# Patient Record
Sex: Female | Born: 1980 | Race: White | Hispanic: No | Marital: Single | Smoking: Never smoker
Health system: Southern US, Community
[De-identification: ages and names within clinical notes are randomized; demographics above are authoritative.]

## PROBLEM LIST (undated history)

## (undated) HISTORY — PX: HAND SURGERY: SHX662

---
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
## 2001-08-28 ENCOUNTER — Emergency Department (HOSPITAL_COMMUNITY): Admission: EM | Admit: 2001-08-28 | Discharge: 2001-08-28 | Payer: Self-pay | Admitting: Emergency Medicine

## 2001-08-31 ENCOUNTER — Emergency Department (HOSPITAL_COMMUNITY): Admission: EM | Admit: 2001-08-31 | Discharge: 2001-08-31 | Payer: Self-pay | Admitting: *Deleted

## 2003-03-05 ENCOUNTER — Emergency Department (HOSPITAL_COMMUNITY): Admission: EM | Admit: 2003-03-05 | Discharge: 2003-03-05 | Payer: Self-pay | Admitting: Emergency Medicine

## 2012-11-26 ENCOUNTER — Ambulatory Visit
Admission: RE | Admit: 2012-11-26 | Discharge: 2012-11-26 | Disposition: A | Discharge status: Home / Self Care | Payer: PRIVATE HEALTH INSURANCE | Source: Ambulatory Visit | Attending: Occupational Medicine | Admitting: Occupational Medicine

## 2012-11-26 ENCOUNTER — Other Ambulatory Visit: Payer: Self-pay | Admitting: Occupational Medicine

## 2012-11-26 DIAGNOSIS — Z021 Encounter for pre-employment examination: Secondary | ICD-10-CM

## 2014-01-20 ENCOUNTER — Encounter (HOSPITAL_COMMUNITY): Payer: Self-pay | Admitting: Emergency Medicine

## 2014-01-20 ENCOUNTER — Emergency Department (HOSPITAL_COMMUNITY): Payer: Worker's Compensation

## 2014-01-20 ENCOUNTER — Emergency Department (HOSPITAL_COMMUNITY)
Admission: EM | Admit: 2014-01-20 | Discharge: 2014-01-20 | Disposition: A | Discharge status: Home / Self Care | Payer: Worker's Compensation | Attending: Emergency Medicine | Admitting: Emergency Medicine

## 2014-01-20 DIAGNOSIS — S62364A Nondisplaced fracture of neck of fourth metacarpal bone, right hand, initial encounter for closed fracture: Secondary | ICD-10-CM | POA: Insufficient documentation

## 2014-01-20 DIAGNOSIS — S60512A Abrasion of left hand, initial encounter: Secondary | ICD-10-CM | POA: Insufficient documentation

## 2014-01-20 DIAGNOSIS — S5002XA Contusion of left elbow, initial encounter: Secondary | ICD-10-CM | POA: Diagnosis not present

## 2014-01-20 DIAGNOSIS — S62609A Fracture of unspecified phalanx of unspecified finger, initial encounter for closed fracture: Secondary | ICD-10-CM

## 2014-01-20 DIAGNOSIS — Y9289 Other specified places as the place of occurrence of the external cause: Secondary | ICD-10-CM | POA: Insufficient documentation

## 2014-01-20 DIAGNOSIS — Y9389 Activity, other specified: Secondary | ICD-10-CM | POA: Diagnosis not present

## 2014-01-20 DIAGNOSIS — T1490XA Injury, unspecified, initial encounter: Secondary | ICD-10-CM

## 2014-01-20 DIAGNOSIS — S6992XA Unspecified injury of left wrist, hand and finger(s), initial encounter: Secondary | ICD-10-CM | POA: Diagnosis present

## 2014-01-20 DIAGNOSIS — S63267A Dislocation of metacarpophalangeal joint of left little finger, initial encounter: Secondary | ICD-10-CM | POA: Insufficient documentation

## 2014-01-20 DIAGNOSIS — Y998 Other external cause status: Secondary | ICD-10-CM | POA: Diagnosis not present

## 2014-01-20 DIAGNOSIS — W11XXXA Fall on and from ladder, initial encounter: Secondary | ICD-10-CM | POA: Insufficient documentation

## 2014-01-20 DIAGNOSIS — S63259A Unspecified dislocation of unspecified finger, initial encounter: Secondary | ICD-10-CM

## 2014-01-20 DIAGNOSIS — W19XXXA Unspecified fall, initial encounter: Secondary | ICD-10-CM

## 2014-01-20 MED ORDER — HYDROCODONE-ACETAMINOPHEN 5-325 MG PO TABS: 1.0000 | ORAL_TABLET | Freq: Four times a day (QID) | ORAL | Status: DC | PRN

## 2014-01-20 MED ORDER — ONDANSETRON 4 MG PO TBDP
4.0000 mg | ORAL_TABLET | Freq: Once | ORAL | Status: AC
  Administered 2014-01-20: 4 mg via ORAL
  Filled 2014-01-20: qty 1

## 2014-01-20 MED ORDER — HYDROCODONE-ACETAMINOPHEN 5-325 MG PO TABS
2.0000 | ORAL_TABLET | Freq: Once | ORAL | Status: AC
  Administered 2014-01-20: 2 via ORAL
  Filled 2014-01-20: qty 2

## 2014-01-20 MED ORDER — FENTANYL CITRATE 0.05 MG/ML IJ SOLN
100.0000 ug | Freq: Once | INTRAMUSCULAR | Status: AC
  Administered 2014-01-20: 100 ug via INTRAVENOUS
  Filled 2014-01-20: qty 2

## 2014-01-20 MED ORDER — LIDOCAINE-EPINEPHRINE (PF) 2 %-1:200000 IJ SOLN
10.0000 mL | Freq: Once | INTRAMUSCULAR | Status: AC
  Administered 2014-01-20: 10 mL
  Filled 2014-01-20: qty 20

## 2014-01-20 MED ORDER — MIDAZOLAM HCL 2 MG/2ML IJ SOLN
4.0000 mg | Freq: Once | INTRAMUSCULAR | Status: AC
  Administered 2014-01-20: 4 mg via INTRAVENOUS
  Filled 2014-01-20: qty 4

## 2014-01-20 MED ORDER — PROPOFOL 10 MG/ML IV BOLUS
INTRAVENOUS | Status: AC
  Administered 2014-01-20: 25 mg via INTRAVENOUS
  Filled 2014-01-20: qty 20

## 2014-01-20 MED ORDER — PROPOFOL 10 MG/ML IV BOLUS
0.5000 mg/kg | Freq: Once | INTRAVENOUS | Status: AC
  Administered 2014-01-20: 25 mg via INTRAVENOUS

## 2014-01-20 NOTE — ED Notes (Signed)
Pt reports relief of pain s/p splint application.  +cms distal to splint noted

## 2014-01-20 NOTE — ED Notes (Signed)
Pt returned from xray, resting comfortably in room with family at bedside

## 2014-01-20 NOTE — ED Notes (Signed)
Pt reports fall from ladder approx 8 feet landing on concrete left hand and side.  Pt arrives awake, alert, oriented, c/o left hand and back pain.

## 2014-01-20 NOTE — ED Provider Notes (Signed)
CSN: [id number]     Arrival date & time 01/20/14  1051 History   First MD Initiated Contact with Patient 01/20/14 1052     Chief Complaint  Patient presents with  . Fall     (Consider location/radiation/quality/duration/timing/severity/associated sxs/prior Treatment) HPI Comments: Patient presents emergency department with chief complaint of fall from ladder. She states that she fell approximately 8 feet from ladder landing on concrete on her left side. She denies any LOC or head injury. She complains of left hand and elbow pain and some pain with inspiration. She denies any other injuries. 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 She has not taken anything to alleviate her symptoms. Her symptoms are worsened with movement. She was wearing all of her protective fire equipment, and thinks that this significantly reduced impact.  The history is provided by the patient. No language interpreter was used.    History reviewed. No pertinent past medical history. History reviewed. No pertinent past surgical history. History reviewed. No pertinent family history. History  Substance Use Topics  . Smoking status: Never Smoker   . Smokeless tobacco: Not on file  . Alcohol Use: No   OB History    No data available     Review of Systems  Constitutional: Negative for fever and chills.  Respiratory: Negative for shortness of breath.   Cardiovascular: Negative for chest pain.  Gastrointestinal: Negative for nausea, vomiting, diarrhea and constipation.  Genitourinary: Negative for dysuria.  Musculoskeletal: Positive for arthralgias.  All other systems reviewed and are negative.     Allergies  Review of patient's allergies indicates no known allergies.  Home Medications   Prior to Admission medications   Not on File   BP 99/67 mmHg  Pulse 79  Temp(Src) 97.8 F (36.6 C) (Oral)  Resp 22  Ht 5\' 8"  (1.727 m)  Wt 161 lb (73.029 kg)  BMI 24.49 kg/m2  SpO2 99%  LMP  01/04/2014 Physical Exam  Constitutional: She is oriented to person, place, and time. She appears well-developed and well-nourished.  HENT:  Head: Normocephalic and atraumatic.  Eyes: Conjunctivae and EOM are normal. Pupils are equal, round, and reactive to light.  Neck: Normal range of motion. Neck supple.  Cardiovascular: Normal rate and regular rhythm.  Exam reveals no gallop and no friction rub.   No murmur heard. Pulmonary/Chest: Effort normal and breath sounds normal. No respiratory distress. She has no wheezes. She has no rales. She exhibits no tenderness.  No anterior or lateral chest wall tenderness, lungs are clear to auscultation  Abdominal: Soft. Bowel sounds are normal. She exhibits no distension and no mass. There is no tenderness. There is no rebound and no guarding.  Musculoskeletal: Normal range of motion. She exhibits no edema or tenderness.  Left hand moderately swollen over the posterior aspect, left grip strength is reduced secondary to pain, no range of motion deficits, left elbow remarkable for a mild contusion, no bony abnormality or deformity  Moves all other extremities through all ranges of motion and with 5 out of 5 strength  Neurological: She is alert and oriented to person, place, and time.  Skin: Skin is warm and dry.  Mild abrasion to left hand, no laceration or puncture wound  Psychiatric: She has a normal mood and affect. Her behavior is normal. Judgment and thought content normal.  Nursing note and vitals reviewed.   ED Course  Procedures (including critical care time) No results found for this or any previous visit. Dg  Chest 2 View  01/20/2014   CLINICAL DATA:  33 year old female with sternal and left chest pain since 0950 hr after fall from ladder 8 feet onto concrete. Initial encounter.  EXAM: CHEST  2 VIEW  COMPARISON:  Chest radiograph 11/26/2012.  FINDINGS: Normal lung volumes. Anterior clear space within normal limits. Normal cardiac size and  mediastinal contours. Visualized tracheal air column is within normal limits. No pneumothorax, pulmonary edema, pleural effusion or confluent pulmonary opacity. Minimal spinal curvature. No acute osseous abnormality identified.  IMPRESSION: No acute cardiopulmonary abnormality or acute traumatic injury identified.   Electronically Signed   By: Augusto GambleLee  Hall M.D.   On: 01/20/2014 12:55   Dg Forearm Left  01/20/2014   CLINICAL DATA:  Fall  EXAM: LEFT FOREARM - 2 VIEW  COMPARISON:  Left hand today  FINDINGS: Fracture base of the fourth metacarpal.  Negative for fracture of the radius or ulna. Wrist joint and elbow joint appear normal.  IMPRESSION: Negative forearm.  Fracture base of the fourth metacarpal.   Electronically Signed   By: Marlan Palauharles  Clark M.D.   On: 01/20/2014 12:57   Dg Hand Complete Left  01/20/2014   CLINICAL DATA:  Larey SeatFell from ladder with left hand and fifth finger pain  EXAM: LEFT HAND - COMPLETE 3+ VIEW  COMPARISON:  None.  FINDINGS: There is dislocation of the left fifth MCP joint with the proximal phalanx lateral and dorsal to the left fifth metacarpal. Also, there is an oblique fracture through the base of the left fourth metacarpal. No other acute abnormality is seen. The radiocarpal joint space is unremarkable.  IMPRESSION: 1. Dislocation of the left fifth MCP joint. 2. Oblique nondisplaced fracture through the base of the left fourth metacarpal.   Electronically Signed   By: Dwyane DeePaul  Barry M.D.   On: 01/20/2014 12:56      EKG Interpretation None      MDM   Final diagnoses:  Fall  Trauma  Finger fracture, left, closed, initial encounter  Finger dislocation, initial encounter    Patient with mechanical fall from ladder. Impact significantly lessened by protective equipment. Will check imaging and reassess. Patient does not want any pain medicine at this time.  Patient fell from ladder landing on side, there was dislocation of the 5th MCP, but no fracture.  She has a small palmar  abrasion which is away from the dislocation and there is no evidence of open joint or open fracture.  Postreduction, patient is able make a fist. Finger splint initially, however will consult hand surgery for proper immobilization guidelines because the patient has the dislocation and concurrent fourth metacarpal fracture.  Roxy Horsemanobert Armida Vickroy, PA-C 01/20/14 1610  Vida RollerBrian D Miller, MD 01/21/14 [phone]

## 2014-01-20 NOTE — Discharge Instructions (Signed)
Finger Dislocation  Finger dislocation is the displacement of bones in your finger at the joints. Most commonly, finger dislocation occurs at the proximal interphalangeal joint (the joint closest to your knuckle). Very strong, fibrous tissues (ligaments) and joint capsules connect the three bones of your fingers.   CAUSES  Dislocation is caused by a forceful impact. This impact moves these bones off the joint and often tears your ligaments.   SYMPTOMS  Symptoms of finger dislocation include:   Deformity of your finger.   Pain, with loss of movement.  DIAGNOSIS   Finger dislocation is diagnosed with a physical exam. Often, X-ray exams are done to see if you have associated injuries, such as bone fractures.  TREATMENT   Finger dislocations are treated by putting your bones back into position (reduction) either by manually moving the bones back into place or through surgery. Your finger is then kept in a fixed position (immobilized) with the use of a dressing or splint for a brief period.  When your ligament has to be surgically repaired, it needs to be kept in a fixed position with a dressing or splint for 1 to 2 weeks. Because joint stiffness is a long-term complication of finger dislocation, hand exercises or physical therapy to increase the range of motion and to regain strength is usually started as soon as the ligament is healed. Exercises and therapy generally last no more than 3 months.  HOME CARE INSTRUCTIONS  The following measures can help to reduce pain and speed up the healing process:   Rest your injured joint. Do not move until instructed otherwise by your caregiver. Avoid activities similar to the one that caused your injury.   Apply ice to your injured joint for the first day or 2 after your reduction or as directed by your caregiver. Applying ice helps to reduce inflammation and pain.   Put ice in a plastic bag.   Place a towel between your skin and the bag.   Leave the ice on for 15-20 minutes  at a time, every 2 hours while you are awake.   Elevate your hand above your heart as directed by your caregiver to reduce swelling.   Take over-the-counter or prescription medicine for pain as your caregiver instructs you.  SEEK IMMEDIATE MEDICAL CARE IF:   Your dressing or splint becomes damaged.   Your pain becomes worse rather than better.   You lose feeling in your finger, or it becomes cold and white.  MAKE SURE YOU:   Understand these instructions.   Will watch your condition.   Will get help right away if you are not doing well or get worse.  Document Released: 01/20/2000 Document Revised: 04/16/2011 Document Reviewed: 11/12/2010  ExitCare Patient Information 2015 [employer]. This information is not intended to replace advice given to you by your health care provider. Make sure you discuss any questions you have with your health care provider.  Finger Fracture  Fractures of fingers are breaks in the bones of the fingers. There are many types of fractures. There are different ways of treating these fractures. Your health care provider will discuss the best way to treat your fracture.  CAUSES  Traumatic injury is the main cause of broken fingers. These include:   Injuries while playing sports.   Workplace injuries.   Falls.  RISK FACTORS  Activities that can increase your risk of finger fractures include:   Sports.   Workplace activities that involve machinery.   A condition called   osteoporosis, which can make your bones less dense and cause them to fracture more easily.  SIGNS AND SYMPTOMS  The main symptoms of a broken finger are pain and swelling within 15 minutes after the injury. Other symptoms include:   Bruising of your finger.   Stiffness of your finger.   Numbness of your finger.   Exposed bones (compound fracture) if the fracture is severe.  DIAGNOSIS   The best way to diagnose a broken bone is with X-ray imaging. Additionally, your health care provider will use this X-ray  image to evaluate the position of the broken finger bones.   TREATMENT   Finger fractures can be treated with:    Nonreduction--This means the bones are in place. The finger is splinted without changing the positions of the bone pieces. The splint is usually left on for about a week to 10 days. This will depend on your fracture and what your health care provider thinks.   Closed reduction--The bones are put back into position without using surgery. The finger is then splinted.   Open reduction and internal fixation--The fracture site is opened. Then the bone pieces are fixed into place with pins or some type of hardware. This is seldom required. It depends on the severity of the fracture.  HOME CARE INSTRUCTIONS    Follow your health care provider's instructions regarding activities, exercises, and physical therapy.   Only take over-the-counter or prescription medicines for pain, discomfort, or fever as directed by your health care provider.  SEEK MEDICAL CARE IF:  You have pain or swelling that limits the motion or use of your fingers.  SEEK IMMEDIATE MEDICAL CARE IF:   Your finger becomes numb.  MAKE SURE YOU:    Understand these instructions.   Will watch your condition.   Will get help right away if you are not doing well or get worse.  Document Released: 05/06/2000 Document Revised: 11/12/2012 Document Reviewed: 09/03/2012  ExitCare Patient Information 2015 [employer]. This information is not intended to replace advice given to you by your health care provider. Make sure you discuss any questions you have with your health care provider.

## 2014-01-20 NOTE — Progress Notes (Signed)
Orthopedic Tech Progress Note Patient Details:  Brittany Bradford [birth year] [id number]  Ortho Devices Type of Ortho Device: Ace wrap, Ulna gutter splint Ortho Device/Splint Location: LUE Ortho Device/Splint Interventions: Ordered, Application   Jennye MoccasinHughes, [name] 01/20/2014, 4:16 PM

## 2015-10-26 ENCOUNTER — Encounter: Payer: Self-pay | Admitting: Internal Medicine

## 2015-10-26 ENCOUNTER — Ambulatory Visit (INDEPENDENT_AMBULATORY_CARE_PROVIDER_SITE_OTHER): Payer: 59 | Admitting: Internal Medicine

## 2015-10-26 VITALS — BP 110/60 | HR 80 | Temp 99.2°F | Resp 16 | Ht 67.0 in | Wt 162.0 lb

## 2015-10-26 DIAGNOSIS — Z0000 Encounter for general adult medical examination without abnormal findings: Secondary | ICD-10-CM | POA: Diagnosis not present

## 2015-10-26 NOTE — Progress Notes (Signed)
Annual Screening Comprehensive Examination   This very nice 35 y.o.female presents for complete physical.  Patient has no major health issues.  Patient reports no complaints at this time.   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  She is very active with work.  She is really big into crossfit.    Patient is in a committed relationship with her partner Victory DakinRiley.  She reports that she has never had a pap smear.  She is nervous about it.  She does not see obgyn.  She would prefer to be a female if she had the opportunity financially to have a sex change.    She does note that she does have some trouble with falling asleep.  She reports that she is wide awake and has trouble getting to sleep.     Finally, patient has history of Vitamin D Deficiency and last vitamin D was No results found for: VD25OH.  Currently on supplementation.     No current outpatient prescriptions on file prior to visit.   No current facility-administered medications on file prior to visit.     No Known Allergies  No past medical history on file.   There is no immunization history on file for this patient.  No past surgical history on file.  No family history on file.  Social History   Social History  . Marital status: Single    Spouse name: N/A  . Number of children: N/A  . Years of education: N/A   Occupational History  . Not on file.   Social History Main Topics  . Smoking status: Never Smoker  . Smokeless tobacco: Not on file  . Alcohol use No  . Drug use: No  . Sexual activity: Not Currently   Other Topics Concern  . Not on file   Social History Narrative  . No narrative on file   Review of Systems  Constitutional: Negative for chills, fever and malaise/fatigue.  HENT: Negative for congestion, ear pain and sore throat.   Respiratory: Negative for cough, shortness of breath and wheezing.   Cardiovascular: Negative for chest pain, palpitations and leg swelling.   Gastrointestinal: Positive for diarrhea. Negative for abdominal pain, blood in stool, constipation, heartburn and melena.  Genitourinary: Negative.   Neurological: Negative for dizziness, sensory change, loss of consciousness and headaches.  Psychiatric/Behavioral: Negative for depression. The patient has insomnia. The patient is not nervous/anxious.       Physical Exam  BP 110/60   Pulse 80   Temp 99.2 F (37.3 C) (Temporal)   Resp 16   Ht 5\' 7"  (1.702 m)   Wt 162 lb (73.5 kg)   BMI 25.37 kg/m   General Appearance: Well nourished and in no apparent distress. Eyes: PERRLA, EOMs, conjunctiva no swelling or erythema, normal fundi and vessels. Sinuses: No frontal/maxillary tenderness ENT/Mouth: EACs patent / TMs  nl. Nares clear without erythema, swelling, mucoid exudates. Oral hygiene is good. No erythema, swelling, or exudate. Tongue normal, non-obstructing. Tonsils not swollen or erythematous. Hearing normal.  Neck: Supple, thyroid normal. No bruits, nodes or JVD. Respiratory: Respiratory effort normal.  BS equal and clear bilateral without rales, rhonci, wheezing or stridor. Cardio: Heart sounds are normal with regular rate and rhythm and no murmurs, rubs or gallops. Peripheral pulses are normal and equal bilaterally without edema. No aortic or femoral bruits. Chest: symmetric with normal excursions and percussion. Abdomen: Flat, soft, with bowl sounds. Nontender, no guarding, rebound, hernias, masses, or  organomegaly.  Lymphatics: Non tender without lymphadenopathy.  Musculoskeletal: Full ROM all peripheral extremities, joint stability, 5/5 strength, and normal gait. Skin: Warm and dry without rashes, lesions, cyanosis, clubbing or  ecchymosis.  Neuro: Cranial nerves intact, reflexes equal bilaterally. Normal muscle tone, no cerebellar symptoms. Sensation intact.  Pysch: Awake and oriented X 3, normal affect, Insight and Judgment appropriate.   Assessment and Plan     1.  Routine general medical examination at a health care facility -UTD on vaccine -patient had labwork done by [facility] which was reviewed and was normal -patient is going to think about getting pap smear done she will let me know whether we will do it here or she needs a referral to obgyn.     Continue prudent diet as discussed, weight control, regular exercise, and medications. Routine screening labs and tests as requested with regular follow-up as recommended.  Over 40 minutes of exam, counseling, chart review and critical decision making was performed

## 2017-09-01 ENCOUNTER — Encounter (HOSPITAL_COMMUNITY): Payer: Self-pay | Admitting: Emergency Medicine

## 2017-09-01 ENCOUNTER — Other Ambulatory Visit: Payer: Self-pay

## 2017-09-01 ENCOUNTER — Emergency Department (HOSPITAL_COMMUNITY)
Admission: EM | Admit: 2017-09-01 | Discharge: 2017-09-01 | Disposition: A | Discharge status: Home / Self Care | Payer: No Typology Code available for payment source | Attending: Emergency Medicine | Admitting: Emergency Medicine

## 2017-09-01 DIAGNOSIS — Z7721 Contact with and (suspected) exposure to potentially hazardous body fluids: Secondary | ICD-10-CM | POA: Diagnosis not present

## 2017-09-01 NOTE — ED Triage Notes (Signed)
Pt is a [place] [employer] that got splashed in L eye with blood while doing a CBG this evening.  Source pt is currently a patient and has already had exposure panel ordered. She has already flushed eye with water.

## 2017-09-01 NOTE — Discharge Instructions (Signed)
As you rinsed her eye out prior to arrival and her not having any physical concerns or complaints today your eye was not irrigated again.  If you have any vision concerns please seek additional medical care.

## 2017-09-01 NOTE — ED Provider Notes (Signed)
[facility] EMERGENCY DEPARTMENT Provider Note   CSN: [id number] Arrival date & time: 09/01/17  1937     History   Chief Complaint Chief Complaint  Patient presents with  . Body Fluid Exposure    HPI Brittany Bradford is a 37 y.o. female who presents today for evaluation after a body fluid exposure.  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  She reports that she immediately decontaminated herself and rinsed her own eye out.  She denies any eye related complaints or concerns at this time.  Denies any vision changes, foreign body sensation, or other eye abnormality.  She states she is primarily here as she was told she needs to check in for billing and insurance reasons.  No physical complaints.  HPI  History reviewed. No pertinent past medical history.  There are no active problems to display for this patient.   Past Surgical History:  Procedure Laterality Date  . HAND SURGERY     Dr. Mina Marble     OB History   None      Home Medications    Prior to Admission medications   Not on File    Family History Family History  Problem Relation Age of Onset  . Cancer Mother 67       breast cancer  . Hypertension Father   . Cancer Maternal Grandfather     Social History Social History   Tobacco Use  . Smoking status: Never Smoker  . Smokeless tobacco: Never Used  Substance Use Topics  . Alcohol use: No  . Drug use: No     Allergies   Patient has no known allergies.   Review of Systems Review of Systems  Eyes: Negative for pain, redness and visual disturbance.  Allergic/Immunologic: Negative for immunocompromised state.  All other systems reviewed and are negative.    Physical Exam Updated Vital Signs BP 111/85 (BP Location: Right Arm)   Pulse 67   Temp 98.1 F (36.7 C) (Oral)   Resp 16   LMP 08/28/2017   SpO2 100%   Physical Exam    Constitutional: She appears well-developed. No distress.  Eyes: Pupils are equal, round, and reactive to light. Conjunctivae and lids are normal. Right eye exhibits no discharge. Left eye exhibits no chemosis, no discharge and no hordeolum. No scleral icterus.  Neck: Normal range of motion.  Pulmonary/Chest: No respiratory distress.  Neurological: She is alert.  Skin: She is not diaphoretic.  Psychiatric: She has a normal mood and affect. Her behavior is normal.  Nursing note and vitals reviewed.    ED Treatments / Results  Labs (all labs ordered are listed, but only abnormal results are displayed) Labs Reviewed - No data to display  EKG None  Radiology No results found.  Procedures Procedures (including critical care time)  Medications Ordered in ED Medications - No data to display   Initial Impression / Assessment and Plan / ED Course  I have reviewed the triage vital signs and the nursing notes.  Pertinent labs & imaging results that were available during my care of the patient were reviewed by me and considered in my medical decision making (see chart for details).    Patient presents today for evaluation after a body fluid exposure.  She was obtaining a CBG on a patient when she had blood splatter across the left side of her face and into  her left eye.  She irrigated her eye prior to arrival.  She did not have any eye related complaints or concerns today.  She denies any vision changes.  Eye is not red on exam.  Offered fluorescein stain, however patient declined stating that her eye feels normal.  Offered visual acuity screening however patient declined.  Rapid HIV from reported source patient was reportedly negative.  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  Patient discharged home.  Return precautions discussed.  Final Clinical Impressions(s) / ED Diagnoses   Final diagnoses:  Exposure to blood or body  fluid    ED Discharge Orders    None       Norman ClayHammond, Samanth Mirkin W, PA-C 09/01/17 2106    Rolan BuccoBelfi, Melanie, MD 09/01/17 2212

## 2017-09-01 NOTE — ED Notes (Signed)
Pt stable, ambulatory, states understanding of discharge instructions 

## 2019-08-01 ENCOUNTER — Other Ambulatory Visit: Payer: Self-pay

## 2019-08-01 ENCOUNTER — Ambulatory Visit (HOSPITAL_COMMUNITY)
Admission: EM | Admit: 2019-08-01 | Discharge: 2019-08-01 | Disposition: A | Discharge status: Home / Self Care | Payer: Worker's Compensation | Attending: Family Medicine | Admitting: Family Medicine

## 2019-08-01 ENCOUNTER — Encounter (HOSPITAL_COMMUNITY): Payer: Self-pay

## 2019-08-01 DIAGNOSIS — Z23 Encounter for immunization: Secondary | ICD-10-CM

## 2019-08-01 DIAGNOSIS — G44309 Post-traumatic headache, unspecified, not intractable: Secondary | ICD-10-CM | POA: Diagnosis not present

## 2019-08-01 DIAGNOSIS — S0181XA Laceration without foreign body of other part of head, initial encounter: Secondary | ICD-10-CM | POA: Diagnosis not present

## 2019-08-01 MED ORDER — BACITRACIN ZINC 500 UNIT/GM EX OINT
TOPICAL_OINTMENT | CUTANEOUS | Status: AC
Start: 2019-08-01 — End: ?
  Filled 2019-08-01: qty 28.35

## 2019-08-01 MED ORDER — NEOMYCIN-POLYMYXIN-HC 3.5-10000-1 OT SUSP
OTIC | Status: AC
  Filled 2019-08-01: qty 10

## 2019-08-01 MED ORDER — AMOXICILLIN-POT CLAVULANATE 875-125 MG PO TABS: 1.0000 | ORAL_TABLET | Freq: Two times a day (BID) | ORAL | 0 refills | Status: AC

## 2019-08-01 MED ORDER — SILVER NITRATE-POT NITRATE 75-25 % EX MISC
CUTANEOUS | Status: AC
  Filled 2019-08-01: qty 10

## 2019-08-01 MED ORDER — TETANUS-DIPHTH-ACELL PERTUSSIS 5-2.5-18.5 LF-MCG/0.5 IM SUSP
INTRAMUSCULAR | Status: AC
  Filled 2019-08-01: qty 0.5

## 2019-08-01 MED ORDER — TETANUS-DIPHTH-ACELL PERTUSSIS 5-2.5-18.5 LF-MCG/0.5 IM SUSP
0.5000 mL | Freq: Once | INTRAMUSCULAR | Status: AC
  Administered 2019-08-01: 0.5 mL via INTRAMUSCULAR

## 2019-08-01 MED ORDER — LIDOCAINE-EPINEPHRINE (PF) 2 %-1:200000 IJ SOLN
INTRAMUSCULAR | Status: AC
  Filled 2019-08-01: qty 20

## 2019-08-01 NOTE — ED Provider Notes (Signed)
[place]   [id number] 08/01/19 Arrival Time: 1500  CC: LAC  SUBJECTIVE:  Brittany Bradford is a 39 y.o. female who presents with a laceration that occurred just prior to arrival. Symptoms began after she was involved in a training at work and she was hit in the forehead with the metal end of a firehouse. Bleeding controlled. Denies LOC, changes in vision. Currently not on blood thinners. Denies similar symptoms in the past. Denies fever, chills, nausea, vomiting, redness, swelling, purulent drainage, decrease strength or sensation.   Td UTD: No.  ROS: As per HPI.  All other pertinent ROS negative.     History reviewed. No pertinent past medical history. Past Surgical History:  Procedure Laterality Date  . HAND SURGERY     Dr. Burney Gauze   No Known Allergies No current facility-administered medications on file prior to encounter.   No current outpatient medications on file prior to encounter.   Social History   Socioeconomic History  . Marital status: Single    Spouse name: Not on file  . Number of children: Not on file  . Years of education: Not on file  . Highest education level: Not on file  Occupational History  . Not on file  Tobacco Use  . Smoking status: Never Smoker  . Smokeless tobacco: Never Used  Substance and Sexual Activity  . Alcohol use: No  . Drug use: No  . Sexual activity: Not Currently  Other Topics Concern  . Not on file  Social History Narrative  . Not on file   Social Determinants of Health   Financial Resource Strain:   . Difficulty of Paying Living Expenses:   Food Insecurity:   . Worried About Charity fundraiser in the Last Year:   . Arboriculturist in the Last Year:   Transportation Needs:   . Film/video editor (Medical):   Marland Kitchen Lack of Transportation (Non-Medical):   Physical Activity:   . Days of Exercise per Week:   . Minutes of Exercise per Session:   Stress:   . Feeling of Stress :   Social Connections:   .  Frequency of Communication with Friends and Family:   . Frequency of Social Gatherings with Friends and Family:   . Attends Religious Services:   . Active Member of Clubs or Organizations:   . Attends Archivist Meetings:   Marland Kitchen Marital Status:   Intimate Partner Violence:   . Fear of Current or Ex-Partner:   . Emotionally Abused:   Marland Kitchen Physically Abused:   . Sexually Abused:    Family History  Problem Relation Age of Onset  . Cancer Mother 70       breast cancer  . Hypertension Father   . Cancer Maternal Grandfather      OBJECTIVE:  Vitals:   08/01/19 1527  BP: 106/71  Pulse: 62  Resp: 17  Temp: 98.1 F (36.7 C)  TempSrc: Oral  SpO2: 98%     General appearance: alert; no distress Skin: laceration of forhead; size: approx 3 cm in length      Psychological: alert and cooperative; normal mood and affect   No results found for this or any previous visit.  Labs Reviewed - No data to display  No results found.  Procedure: Verbal consent obtained. Patient provided with risks and alternatives to the procedure. Wound copiously irrigated with NS  Anesthetized with 2 mL of lidocaine with epinephrine. Wound carefully explored. No foreign body,  tendon injury, or nonviable tissue were noted. Using sterile technique 6 interrupted 4-0 Ethilon Prolene sutures were placed to reapproximate the wound. Patient tolerated procedure well. No complications. Minimal bleeding. Patient advised to look for and return for any signs of infection such as redness, swelling, discharge, or worsening pain. Return for suture removal in 7 days.  ASSESSMENT & PLAN:  1. Laceration of forehead, initial encounter   2. Post-traumatic headache, not intractable, unspecified chronicity pattern     Meds ordered this encounter  Medications  . Tdap (BOOSTRIX) injection 0.5 mL  . amoxicillin-clavulanate (AUGMENTIN) 875-125 MG tablet    Sig: Take 1 tablet by mouth 2 (two) times daily for 10 days.     Dispense:  20 tablet    Refill:  0    Order Specific Question:   Supervising Provider    Answer:   Merrilee Jansky [id number]   Forehead Lac Head Pain  Presents with forehead lac acquired just prior to arrival in out office Bandage applied Keep covered for next and dry for next 24-48 hours.   After then you may gently clean with warm water and mild soap.  Avoid submerging wound in water. Change dressing daily and apply a thin layer of neosporin.  Prescribed Augmentin Tetanus vaccine in office today Return in 7 days to have sutures removed.  Take OTC ibuprofen or tylenol as needed for pain releif Return sooner or go to the ED if you have any new or worsening symptoms such as increased pain, redness, swelling, drainage, discharge, decreased range of motion of extremity.   Reviewed expectations re: course of current medical issues. Questions answered. Outlined signs and symptoms indicating need for more acute intervention. Patient verbalized understanding. After Visit Summary given.    Moshe Cipro, NP 08/01/19 2056

## 2019-08-01 NOTE — Discharge Instructions (Signed)
Return here in 5-7 days for recheck and removal of sutures  Take the antibiotic twice daily for 10 days  Tetanus booster given in office today  Follow up if there is any new drainage, redness, swelling, heat, other concerning symptoms

## 2019-08-01 NOTE — ED Triage Notes (Signed)
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

## 2021-06-07 NOTE — Progress Notes (Signed)
?[facility] ? ?Assessment and Plan:  ? ?Brittany Bradford was seen today for establish care. ? ?Diagnoses and all orders for this visit: ? ?Thyroid nodule ?Had ultrasound through a screening service- Showed 2.1 cm right thyroid nodule. Will order Thyroid biopsy, may require additional thyroid ultrasound prior ?-     Thyroid Panel With TSH ?-     [place] FNA BX THYROID 1ST LESION AFIRMA; Future ? ?Acute pain of left knee ?Continue to wear brace and use NSAIDs as necessary ?-     Ambulatory referral to Orthopedics ? ?Palpitation ?Continue to monitor, if begin to worsen notify the office ?Go to the ER if any chest pain, shortness of breath, nausea, dizziness, severe HA, changes vision/speech  ?  ?Continue diet and meds as discussed. Further disposition pending results of labs. Discussed med's effects and SE's.   ?Over 30 minutes of exam, counseling, chart review, and critical decision making was performed.  ? ?No future appointments. ? ? ?---------------------------------------------------------------------------------------------------------------------- ? ?HPI ?41 y.o. female  presents for 3 month follow up on hypertension, cholesterol, diabetes, weight and vitamin D deficiency.  ? ?Solid nodule 2.1 cm of right lobe on thyroid ultrasound on 05/23/21. Single cyst in left lobe of thyroid 0.3 cm. Had Ultrasound through a screening company - pt has results on her phone ? ?BMI is Body mass index is 28.97 kg/m?., she has been working on diet and exercise. ?Wt Readings from Last 3 Encounters:  ?06/12/21 182 lb 3.2 oz (82.6 kg)  ?10/26/15 162 lb (73.5 kg)  ?01/20/14 161 lb (73 kg)  ? ? ?Her blood pressure has been controlled at home, today their BP is BP: 112/68  ?BP Readings from Last 3 Encounters:  ?06/12/21 112/68  ?08/01/19 106/71  ?09/01/17 111/85  ?  She does workout. She denies chest pain, shortness of breath, dizziness. ?She will have an occasional flutter and shortness of breath. Denies chest pain , dizziness and blurred  vision. Had EKG recently that was normal.  Does have increased stress in her life currently and notes more palpitations when she is noticing more stress  ? ?Has also been experiencing pain and swelling in her left knee.  Did play sports and is uncertain if she could have injured her knee then.  Denies the knee locking, does hear some clicking in both knees. ? ? She is not on cholesterol medication  Her cholesterol is at goal.  Last lipid total - 173, HDL- 60, LDL 98, Triglycerides-61 ? ? ?Had labs checked 12/2020 and glucose 85, GFR 103, AST 20, ALT 17 ?H/H 15/45. ? ? ? ? ?Current Medications:  ?Current Outpatient Medications on File Prior to Visit  ?Medication Sig  ? Multiple Vitamin (MULTIVITAMIN) tablet Take 1 tablet by mouth daily.  ? ?No current facility-administered medications on file prior to visit.  ? ? ? ?Allergies: No Known Allergies  ? ?Medical History: No past medical history on file. ?Family history- Reviewed and unchanged ?Social history- Reviewed and unchanged ? ? ?Review of Systems:  ?Review of Systems  ?Constitutional:  Negative for chills and fever.  ?HENT:  Negative for congestion, hearing loss, sinus pain, sore throat and tinnitus.   ?Eyes:  Negative for blurred vision and double vision.  ?Respiratory:  Negative for cough, hemoptysis, sputum production, shortness of breath and wheezing.   ?Cardiovascular:  Positive for palpitations (fluttering). Negative for chest pain and leg swelling.  ?Gastrointestinal:  Negative for abdominal pain, constipation, diarrhea, heartburn, nausea and vomiting.  ?Genitourinary:  Negative for dysuria and  urgency.  ?Musculoskeletal:  Positive for joint pain (left knee). Negative for back pain, falls, myalgias and neck pain.  ?Skin:  Negative for rash.  ?Neurological:  Negative for dizziness, tingling, tremors, weakness and headaches.  ?Endo/Heme/Allergies:  Does not bruise/bleed easily.  ?Psychiatric/Behavioral:  Negative for depression and suicidal ideas. The patient  is not nervous/anxious and does not have insomnia.   ? ? ? ?Physical Exam: ?BP 112/68   Pulse (!) 56   Temp 97.7 ?F (36.5 ?C)   Ht 5' 6.5" (1.689 m)   Wt 182 lb 3.2 oz (82.6 kg)   LMP 06/01/2021   SpO2 97%   BMI 28.97 kg/m?  ?Wt Readings from Last 3 Encounters:  ?06/12/21 182 lb 3.2 oz (82.6 kg)  ?10/26/15 162 lb (73.5 kg)  ?01/20/14 161 lb (73 kg)  ? ?General Appearance: Well nourished, in no apparent distress. ?Eyes: PERRLA, EOMs, conjunctiva no swelling or erythema ?Sinuses: No Frontal/maxillary tenderness ?ENT/Mouth: Ext aud canals clear, TMs without erythema, bulging. No erythema, swelling, or exudate on post pharynx.  Tonsils not swollen or erythematous. Hearing normal.  ?Neck: Supple, nodule felt on right side.  ?Respiratory: Respiratory effort normal, BS equal bilaterally without rales, rhonchi, wheezing or stridor.  ?Cardio: RRR with no MRGs. Brisk peripheral pulses without edema.  ?Abdomen: Soft, + BS.  Non tender, no guarding, rebound, hernias, masses. ?Lymphatics: Non tender without lymphadenopathy.  ?Musculoskeletal: Full ROM, 5/5 strength, Normal gait. Left knee is mildly swollen. Some tenderness on lateral aspect of left knee with internal rotation ?Skin: Warm, dry without rashes, lesions, ecchymosis.  ?Neuro: Cranial nerves intact. No cerebellar symptoms.  ?Psych: Awake and oriented X 3, normal affect, Insight and Judgment appropriate.  ? ? ?Revonda Humphrey, NP ?9:20 AM ?[facility] ? ?

## 2021-06-12 ENCOUNTER — Ambulatory Visit: Payer: 59 | Admitting: Nurse Practitioner

## 2021-06-12 ENCOUNTER — Encounter: Payer: Self-pay | Admitting: Nurse Practitioner

## 2021-06-12 ENCOUNTER — Encounter: Payer: Self-pay | Admitting: Internal Medicine

## 2021-06-12 VITALS — BP 112/68 | HR 56 | Temp 97.7°F | Ht 66.5 in | Wt 182.2 lb

## 2021-06-12 DIAGNOSIS — E041 Nontoxic single thyroid nodule: Secondary | ICD-10-CM

## 2021-06-12 DIAGNOSIS — R002 Palpitations: Secondary | ICD-10-CM | POA: Diagnosis not present

## 2021-06-12 DIAGNOSIS — Z130 Encounter for screening for diseases of the blood and blood-forming organs and certain disorders involving the immune mechanism: Secondary | ICD-10-CM

## 2021-06-12 DIAGNOSIS — Z79899 Other long term (current) drug therapy: Secondary | ICD-10-CM

## 2021-06-12 DIAGNOSIS — Z1329 Encounter for screening for other suspected endocrine disorder: Secondary | ICD-10-CM

## 2021-06-12 DIAGNOSIS — Z13220 Encounter for screening for lipoid disorders: Secondary | ICD-10-CM

## 2021-06-12 DIAGNOSIS — M25562 Pain in left knee: Secondary | ICD-10-CM

## 2021-06-12 DIAGNOSIS — Z131 Encounter for screening for diabetes mellitus: Secondary | ICD-10-CM

## 2021-06-13 LAB — THYROID PANEL WITH TSH
Free Thyroxine Index: 2.3 (ref 1.4–3.8)
T3 Uptake: 30 % (ref 22–35)
T4, Total: 7.5 ug/dL (ref 5.1–11.9)
TSH: 1.43 mIU/L

## 2021-06-14 ENCOUNTER — Other Ambulatory Visit: Payer: Self-pay | Admitting: Nurse Practitioner

## 2021-06-14 DIAGNOSIS — E041 Nontoxic single thyroid nodule: Secondary | ICD-10-CM

## 2021-06-19 ENCOUNTER — Ambulatory Visit
Admission: RE | Admit: 2021-06-19 | Discharge: 2021-06-19 | Disposition: A | Discharge status: Home / Self Care | Payer: 59 | Source: Ambulatory Visit | Attending: Nurse Practitioner | Admitting: Nurse Practitioner

## 2021-06-19 DIAGNOSIS — E041 Nontoxic single thyroid nodule: Secondary | ICD-10-CM

## 2021-06-28 ENCOUNTER — Ambulatory Visit
Admission: RE | Admit: 2021-06-28 | Discharge: 2021-06-28 | Disposition: A | Discharge status: Home / Self Care | Payer: 59 | Source: Ambulatory Visit | Attending: Nurse Practitioner | Admitting: Nurse Practitioner

## 2021-06-28 ENCOUNTER — Other Ambulatory Visit (HOSPITAL_COMMUNITY)
Admission: RE | Admit: 2021-06-28 | Discharge: 2021-06-28 | Disposition: A | Discharge status: Home / Self Care | Payer: 59 | Source: Ambulatory Visit | Attending: Student | Admitting: Student

## 2021-06-28 DIAGNOSIS — E041 Nontoxic single thyroid nodule: Secondary | ICD-10-CM | POA: Diagnosis present

## 2021-06-29 LAB — CYTOLOGY - NON PAP

## 2022-10-24 IMAGING — US US FNA BIOPSY THYROID 1ST LESION
1 series · 13 of 17 positions shown · non-contrast
Comparison: Ultrasound thyroid 06/19/2021

MEDICATIONS:
1% lidocaine 3 mL

COMPLICATIONS:
None immediate.

INDICATION: Indeterminate right mid thyroid nodule

EXAM:
ULTRASOUND GUIDED FINE NEEDLE ASPIRATION OF INDETERMINATE THYROID
NODULE
TECHNIQUE: Informed written consent was obtained from the patient after a
discussion of the risks, benefits and alternatives to treatment.
Questions regarding the procedure were encouraged and answered. A
timeout was performed prior to the initiation of the procedure.

[Series 1: us fna biopsy thyroid 1st lesion · 0.06mm/px · 17 acquisitions, 13 frames shown]
[im 1/17]
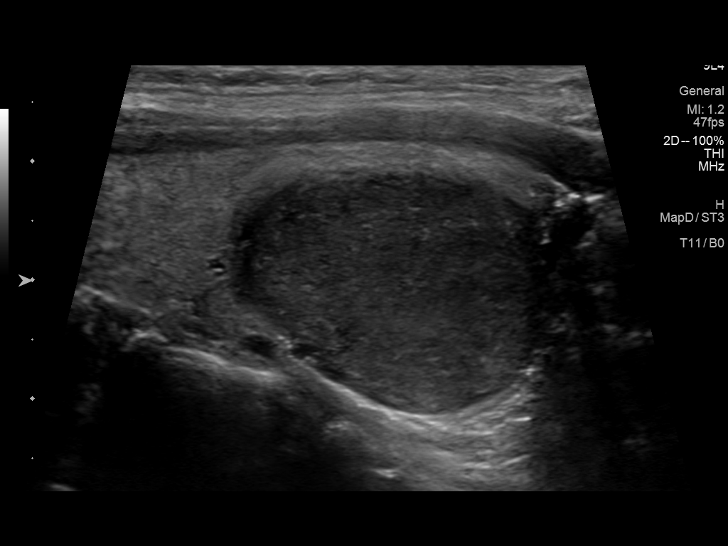
[im 2/17]
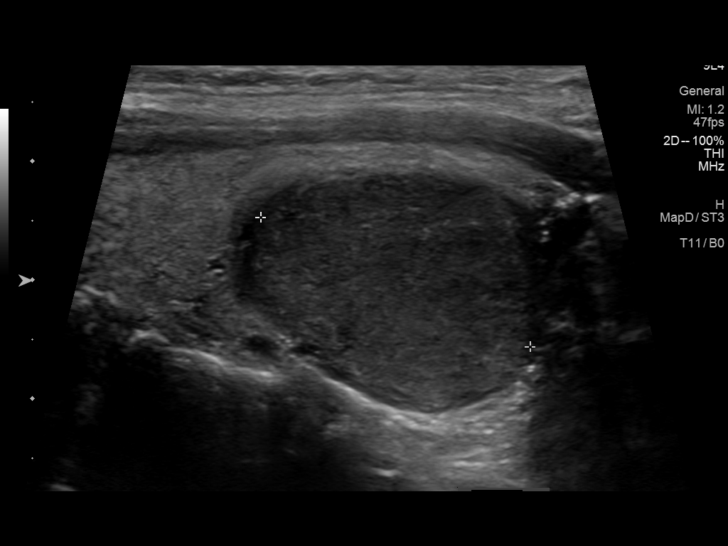
[im 4/17]
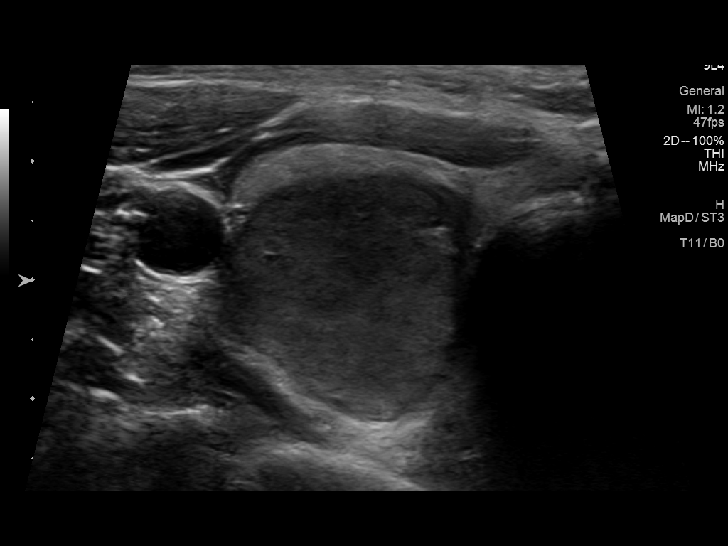
[im 5/17]
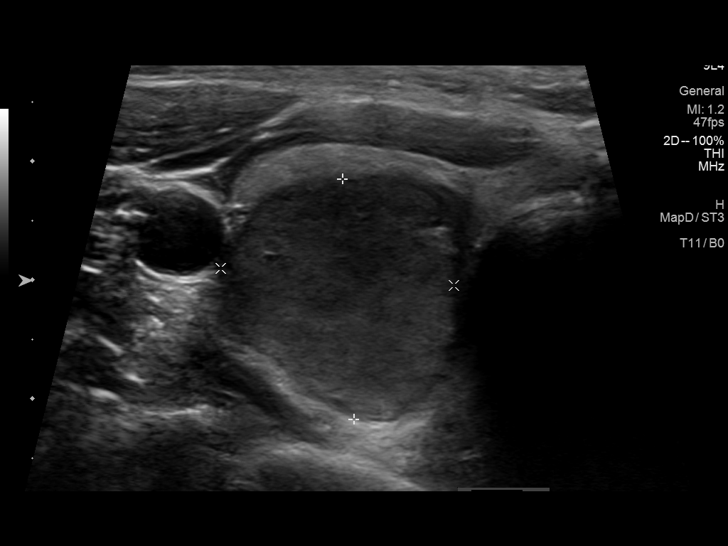
[im 6/17]
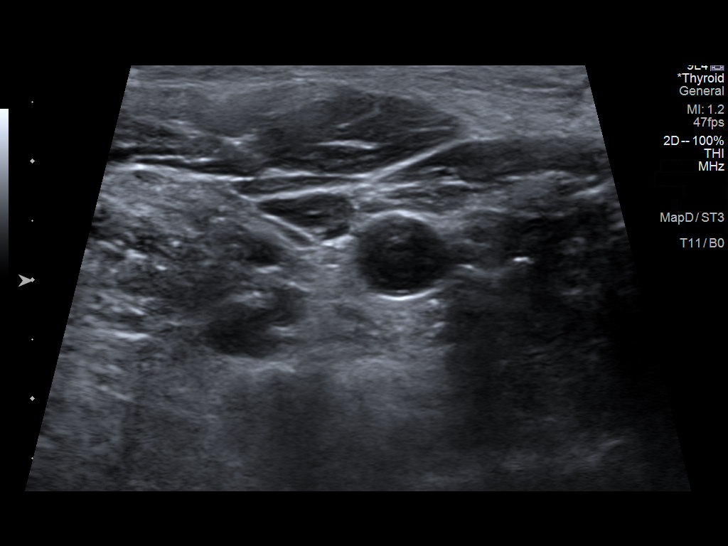
[im 8/17]
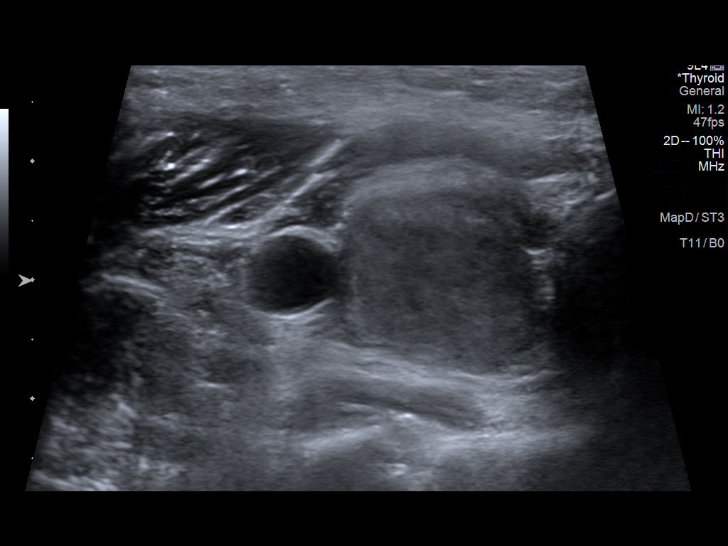
[im 9/17]
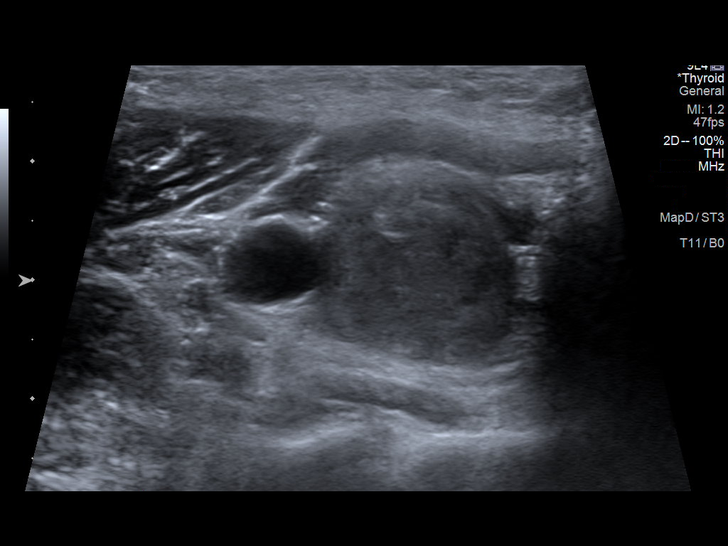
[im 10/17]
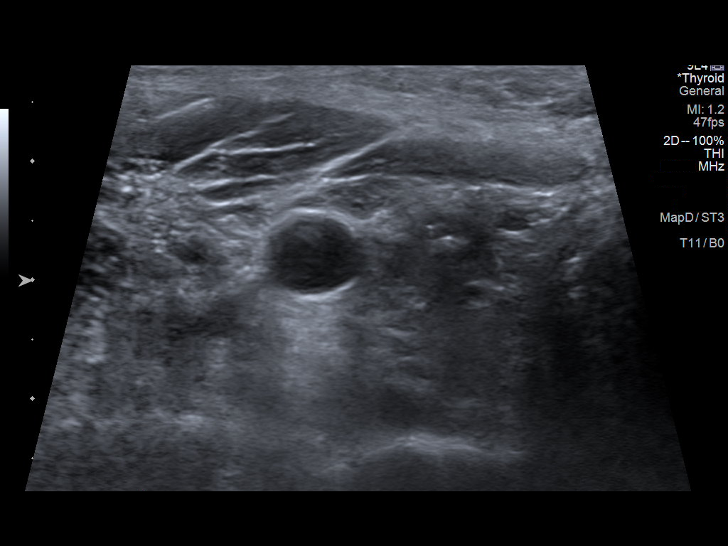
[im 12/17]
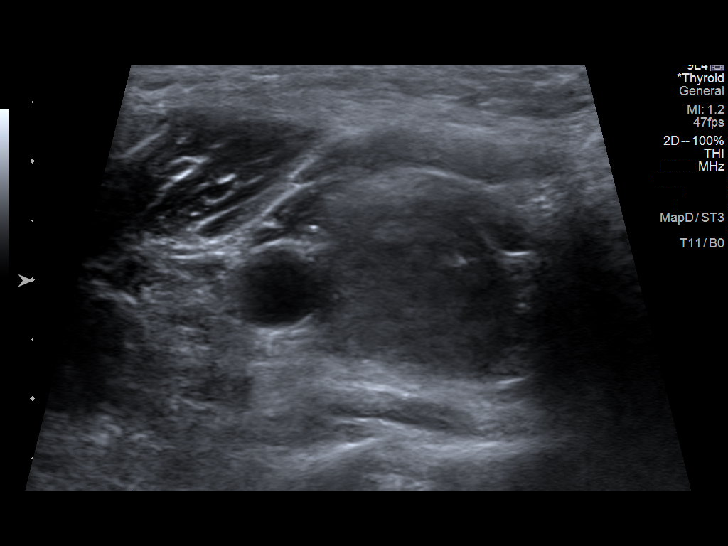
[im 13/17]
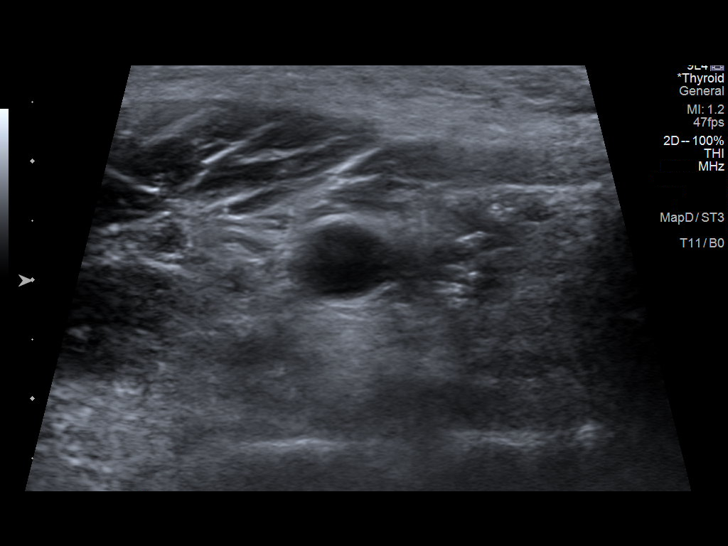
[im 14/17]
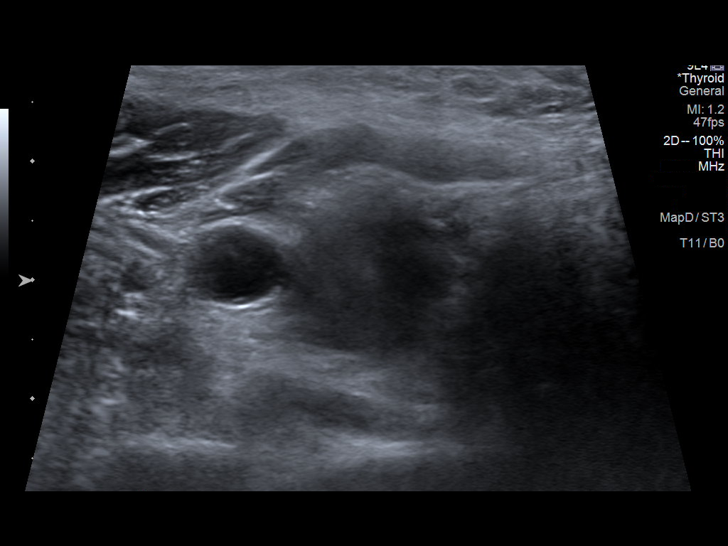
[im 16/17]
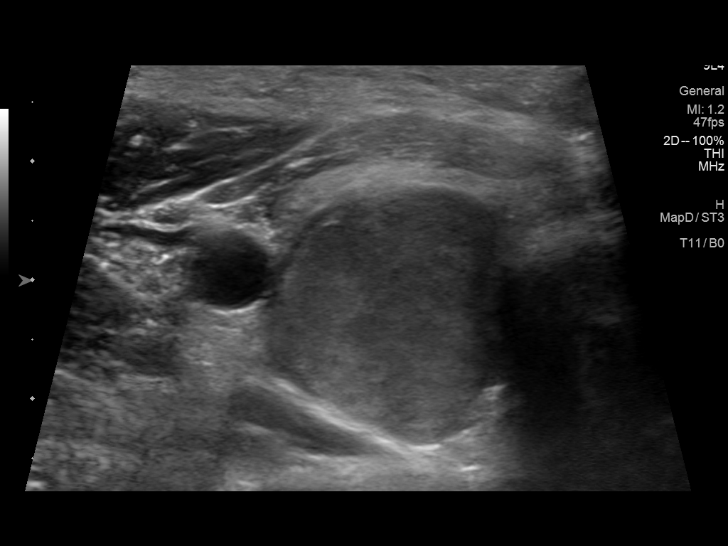
[im 17/17]
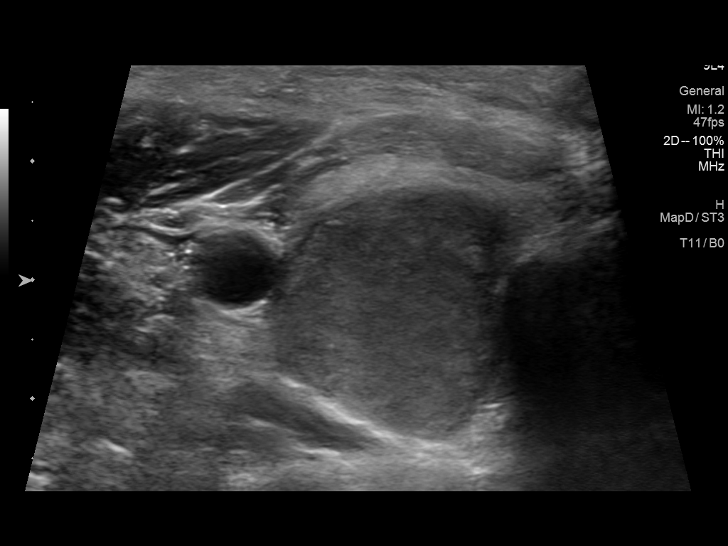

[13 of 17 positions shown; findings below may reference images not displayed]

Pre-procedural ultrasound scanning demonstrated unchanged size and
appearance of the indeterminate nodule within the right mid thyroid.

The procedure was planned. The neck was prepped in the usual sterile
fashion, and a sterile drape was applied covering the operative
field. A timeout was performed prior to the initiation of the
procedure. Local anesthesia was provided with 1% lidocaine.

Under direct ultrasound guidance, 6 FNA biopsies were performed of
the right mid thyroid nodule with a 27 gauge needle. Multiple
ultrasound images were saved for procedural documentation purposes.
The samples were prepared and submitted to pathology. Two of the
samples were prepared for Afirma testing.

Limited post procedural scanning was negative for hematoma or
additional complication. Dressings were placed. The patient
tolerated the above procedures procedure well without immediate
postprocedural complication.
FINDINGS: Nodule reference number based on prior diagnostic ultrasound: 1

Maximum size: 2.7 cm

Location: Right; Mid

ACR TI-RADS risk category: TR3 (3 points)

Reason for biopsy: meets ACR TI-RADS criteria

Ultrasound imaging confirms appropriate placement of the needles
within the thyroid nodule.
IMPRESSION: Technically successful ultrasound guided fine needle aspiration of
right mid thyroid nodule. Read by: Ryuto Sawafuji, NP

## 2023-03-19 ENCOUNTER — Encounter: Payer: Self-pay | Admitting: *Deleted

## 2023-12-02 ENCOUNTER — Other Ambulatory Visit (HOSPITAL_BASED_OUTPATIENT_CLINIC_OR_DEPARTMENT_OTHER): Payer: Self-pay | Admitting: Family Medicine

## 2023-12-02 DIAGNOSIS — Z8249 Family history of ischemic heart disease and other diseases of the circulatory system: Secondary | ICD-10-CM

## 2024-01-27 ENCOUNTER — Ambulatory Visit (HOSPITAL_BASED_OUTPATIENT_CLINIC_OR_DEPARTMENT_OTHER)
Admission: RE | Admit: 2024-01-27 | Discharge: 2024-01-27 | Disposition: A | Discharge status: Home / Self Care | Payer: Self-pay | Source: Ambulatory Visit | Attending: Family Medicine | Admitting: Family Medicine

## 2024-01-27 DIAGNOSIS — Z8249 Family history of ischemic heart disease and other diseases of the circulatory system: Secondary | ICD-10-CM
